# Patient Record
Sex: Female | Born: 1945 | Race: White | Hispanic: No | Marital: Married | Smoking: Never smoker
Health system: Southern US, Community
[De-identification: ages and names within clinical notes are randomized; demographics above are authoritative.]

## PROBLEM LIST (undated history)

## (undated) DIAGNOSIS — E119 Type 2 diabetes mellitus without complications: Secondary | ICD-10-CM

## (undated) DIAGNOSIS — I10 Essential (primary) hypertension: Secondary | ICD-10-CM

## (undated) HISTORY — PX: EYE SURGERY: SHX253

## (undated) HISTORY — PX: ABDOMINAL HYSTERECTOMY: SHX81

## (undated) HISTORY — PX: TONSILLECTOMY: SUR1361

---

## 1998-06-15 ENCOUNTER — Other Ambulatory Visit: Admission: RE | Admit: 1998-06-15 | Discharge: 1998-06-15 | Payer: Self-pay | Admitting: Obstetrics and Gynecology

## 2019-11-02 ENCOUNTER — Emergency Department (HOSPITAL_BASED_OUTPATIENT_CLINIC_OR_DEPARTMENT_OTHER): Payer: Medicare Other

## 2019-11-02 ENCOUNTER — Other Ambulatory Visit: Payer: Self-pay

## 2019-11-02 ENCOUNTER — Emergency Department (HOSPITAL_BASED_OUTPATIENT_CLINIC_OR_DEPARTMENT_OTHER)
Admission: EM | Admit: 2019-11-02 | Discharge: 2019-11-02 | Disposition: A | Discharge status: Home / Self Care | Payer: Medicare Other | Attending: Emergency Medicine | Admitting: Emergency Medicine

## 2019-11-02 DIAGNOSIS — Z7982 Long term (current) use of aspirin: Secondary | ICD-10-CM | POA: Diagnosis not present

## 2019-11-02 DIAGNOSIS — W010XXA Fall on same level from slipping, tripping and stumbling without subsequent striking against object, initial encounter: Secondary | ICD-10-CM | POA: Diagnosis not present

## 2019-11-02 DIAGNOSIS — S20211A Contusion of right front wall of thorax, initial encounter: Secondary | ICD-10-CM

## 2019-11-02 DIAGNOSIS — Z79899 Other long term (current) drug therapy: Secondary | ICD-10-CM | POA: Diagnosis not present

## 2019-11-02 DIAGNOSIS — S6991XA Unspecified injury of right wrist, hand and finger(s), initial encounter: Secondary | ICD-10-CM | POA: Diagnosis present

## 2019-11-02 DIAGNOSIS — Y999 Unspecified external cause status: Secondary | ICD-10-CM | POA: Diagnosis not present

## 2019-11-02 DIAGNOSIS — S52591A Other fractures of lower end of right radius, initial encounter for closed fracture: Secondary | ICD-10-CM | POA: Diagnosis not present

## 2019-11-02 DIAGNOSIS — Y92007 Garden or yard of unspecified non-institutional (private) residence as the place of occurrence of the external cause: Secondary | ICD-10-CM | POA: Insufficient documentation

## 2019-11-02 DIAGNOSIS — Z794 Long term (current) use of insulin: Secondary | ICD-10-CM | POA: Diagnosis not present

## 2019-11-02 DIAGNOSIS — S2231XA Fracture of one rib, right side, initial encounter for closed fracture: Secondary | ICD-10-CM | POA: Insufficient documentation

## 2019-11-02 DIAGNOSIS — Y93H2 Activity, gardening and landscaping: Secondary | ICD-10-CM | POA: Insufficient documentation

## 2019-11-02 DIAGNOSIS — E119 Type 2 diabetes mellitus without complications: Secondary | ICD-10-CM | POA: Diagnosis not present

## 2019-11-02 DIAGNOSIS — W19XXXA Unspecified fall, initial encounter: Secondary | ICD-10-CM

## 2019-11-02 DIAGNOSIS — S52501A Unspecified fracture of the lower end of right radius, initial encounter for closed fracture: Secondary | ICD-10-CM

## 2019-11-02 NOTE — ED Provider Notes (Addendum)
[facility] EMERGENCY DEPARTMENT Provider Note   CSN: [id number] Arrival date & time: 11/02/19  1137     History Chief Complaint  Patient presents with  . Fall    Kristi Long is a 74 y.o. female.  The history is provided by the patient.  Fall This is a new (Patient was in her flower bed 6 days ago cutting her roses when she tripped over one of the pavers and fell approximately 2 feet backwards onto the ground out of the flower bed.) problem. Episode onset: 1 week ago. The problem occurs constantly. The problem has been gradually improving. Associated symptoms include chest pain. Pertinent negatives include no headaches and no shortness of breath. Associated symptoms comments: Right wrist pain.  Skin tear to the left leg.  Patient does think she hit her head when she fell backwards but did not lose consciousness and does not take anticoagulation.  She has not had a headache or neck pain.  Pain in her chest is worsened with certain movements of her arms.  She denies any shortness of breath.  She has had some mild swelling in her bilateral lower extremities which is worse at the end of the day and better in the morning when she gets out of bed.  She states the pain has improved but is still present with certain movements and that is why she came to be evaluated.  Also significant pain to the right wrist which is gradually improving but also occurred during the fall.. She has tried rest for the symptoms. The treatment provided moderate relief.       No past medical history on file.  There are no problems to display for this patient.      OB History   No obstetric history on file.     No family history on file.  Social History   Tobacco Use  . Smoking status: Not on file  Substance Use Topics  . Alcohol use: Not on file  . Drug use: Not on file    Home Medications Prior to Admission medications   Medication Sig Start Date End Date Taking? Authorizing  Provider  aspirin 81 MG EC tablet Take by mouth. 07/02/15  Yes [provider]  Insulin Glargine (BASAGLAR KWIKPEN) 100 UNIT/ML Inject into the skin. 09/28/19  Yes [provider]  metFORMIN (GLUCOPHAGE-XR) 500 MG 24 hr tablet TAKE 1 TABLET BY MOUTH 2 TIMES A DAY 01/31/15  Yes [provider]  alendronate (FOSAMAX) 70 MG tablet Take 70 mg by mouth once a week. 09/02/19   [provider]  ALPRAZolam Prudy Feeler) 0.5 MG tablet Take 0.5 mg by mouth 2 (two) times daily as needed. 10/26/19   [provider]  glimepiride (AMARYL) 4 MG tablet Take 4 mg by mouth 2 (two) times daily. 09/17/19   [provider]  losartan-hydrochlorothiazide (HYZAAR) 100-12.5 MG tablet Take 1 tablet by mouth daily. 10/22/19   [provider]    Allergies    Patient has no allergy information on record.  Review of Systems   Review of Systems  Respiratory: Negative for shortness of breath.   Cardiovascular: Positive for chest pain.  Neurological: Negative for headaches.  All other systems reviewed and are negative.   Physical Exam Updated Vital Signs BP (!) 148/65 (BP Location: Left Arm)   Pulse 77   Temp 98.5 F (36.9 C) (Oral)   Resp 16   Ht 5\' 3"  (1.6 m)   Wt 74.8 kg  SpO2 98%   BMI 29.23 kg/m   Physical Exam Vitals and nursing note reviewed.  Constitutional:      General: She is not in acute distress.    Appearance: Normal appearance. She is well-developed.  HENT:     Head: Normocephalic and atraumatic.     Mouth/Throat:     Mouth: Mucous membranes are moist.  Eyes:     Conjunctiva/sclera: Conjunctivae normal.     Pupils: Pupils are equal, round, and reactive to light.  Cardiovascular:     Rate and Rhythm: Normal rate and regular rhythm.     Pulses: Normal pulses.     Heart sounds: No murmur.  Pulmonary:     Effort: Pulmonary effort is normal. No respiratory distress.     Breath sounds: Normal breath sounds. No wheezing or rales.    Abdominal:     General: There is no distension.     Palpations: Abdomen is soft.     Tenderness: There is no abdominal tenderness. There is no guarding or rebound.  Musculoskeletal:        General: No tenderness. Normal range of motion.     Right wrist: Swelling and bony tenderness present. No snuff box tenderness. Normal range of motion.       Arms:     Cervical back: Normal range of motion and neck supple. No rigidity or tenderness.     Right lower leg: Edema present.     Left lower leg: Edema present.     Comments: 1+ pitting edema in the bilateral feet and ankles.  Healing skin tear to the left posterior calf  Skin:    General: Skin is warm and dry.     Findings: No erythema or rash.  Neurological:     General: No focal deficit present.     Mental Status: She is alert and oriented to person, place, and time. Mental status is at baseline.  Psychiatric:        Mood and Affect: Mood normal.        Behavior: Behavior normal.        Thought Content: Thought content normal.     ED Results / Procedures / Treatments   Labs (all labs ordered are listed, but only abnormal results are displayed) Labs Reviewed - No data to display  EKG None  Radiology DG Ribs Unilateral W/Chest Right  Result Date: 11/02/2019 CLINICAL DATA:  Fall, pain EXAM: RIGHT RIBS AND CHEST - 3+ VIEW COMPARISON:  10/19/2013 FINDINGS: No fracture or other bone lesions are seen involving the ribs. There is no evidence of pneumothorax or pleural effusion. Both lungs are clear. Heart size and mediastinal contours are within normal limits. IMPRESSION: No displaced rib fractures.  No acute abnormality of the lungs. Electronically Signed   By: Eddie Candle M.D.   On: 11/02/2019 12:34   DG Wrist Complete Right  Result Date: 11/02/2019 CLINICAL DATA:  Fall, pain EXAM: RIGHT WRIST - COMPLETE 3+ VIEW COMPARISON:  None. FINDINGS: Osteopenia. Subtle, minimally displaced intra-articular fracture of the dorsal distal radius,  seen only on lateral view. The distal ulna appears intact. Carpus is normally aligned. Soft tissues are unremarkable. IMPRESSION: Subtle, minimally displaced intra-articular fracture of the dorsal distal radius, seen only on lateral view. The distal ulna appears intact. Electronically Signed   By: Eddie Candle M.D.   On: 11/02/2019 12:38    Procedures Procedures (including critical care time)  Medications Ordered in ED Medications - No data to display  ED  Course  I have reviewed the triage vital signs and the nursing notes.  Pertinent labs & imaging results that were available during my care of the patient were reviewed by me and considered in my medical decision making (see chart for details).    MDM Rules/Calculators/A&P                      Patient is a 74 year old female with a fall 6 days ago with ongoing tenderness in the right chest with certain movements and pain in her right wrist.  Patient does not take anticoagulation and states she did hit her head mildly but has not had any headaches or neck pain.  Patient is well-appearing on exam with reassuring vital signs.  Breath sounds are equal bilaterally with low suspicion for pneumothorax.  We will do plain films to ensure no wrist or rib fracture.  Patient does have some mild edema in her lower extremities but reports is better in the morning when she gets out of bed and worse towards the end of the day.  She also states she is on her feet regularly and is most likely venous insufficiency. 1:18 PM Chest x-ray without acute findings, wrist film shows a subtle minimally displaced intra-articular fracture of the dorsal distal radius with an intact ulna.  Patient will be placed in a splint and given follow-up with sports medicine.  2:04 PM Patient examined after splint was placed with good capillary refill and normal sensation in the fingers. Final Clinical Impression(s) / ED Diagnoses Final diagnoses:  Fall, initial encounter  Closed  fracture of distal end of right radius, unspecified fracture morphology, initial encounter  Rib contusion, right, initial encounter    Rx / DC Orders ED Discharge Orders    None       Gwyneth Sprout, MD 11/02/19 1322    Gwyneth Sprout, MD 11/02/19 1404

## 2019-11-02 NOTE — ED Triage Notes (Signed)
Pt sustained mechanical fall on Thursday and has musculoskeletal pain across right side of chest with movement, and sternal pain. Right wrist pain and a skin tear on the left leg.

## 2019-11-02 NOTE — Discharge Instructions (Signed)
You need to keep the splint dry and it cannot be removed.  Follow up with Dr. Jordan Likes and they can place you in a more permanent cast.

## 2019-11-02 NOTE — ED Notes (Signed)
Call to husband Kristi Long,provided update, waiting in car.  Requests to call when ready for discharge [phone]

## 2019-11-07 ENCOUNTER — Ambulatory Visit (HOSPITAL_BASED_OUTPATIENT_CLINIC_OR_DEPARTMENT_OTHER)
Admission: RE | Admit: 2019-11-07 | Discharge: 2019-11-07 | Disposition: A | Discharge status: Home / Self Care | Payer: Medicare Other | Source: Ambulatory Visit | Attending: Family Medicine | Admitting: Family Medicine

## 2019-11-07 ENCOUNTER — Ambulatory Visit (INDEPENDENT_AMBULATORY_CARE_PROVIDER_SITE_OTHER): Payer: Medicare Other | Admitting: Family Medicine

## 2019-11-07 ENCOUNTER — Other Ambulatory Visit: Payer: Self-pay

## 2019-11-07 ENCOUNTER — Encounter: Payer: Self-pay | Admitting: Family Medicine

## 2019-11-07 VITALS — BP 181/92 | HR 81 | Ht 63.0 in | Wt 165.0 lb

## 2019-11-07 DIAGNOSIS — S52591A Other fractures of lower end of right radius, initial encounter for closed fracture: Secondary | ICD-10-CM

## 2019-11-07 DIAGNOSIS — S52501A Unspecified fracture of the lower end of right radius, initial encounter for closed fracture: Secondary | ICD-10-CM | POA: Insufficient documentation

## 2019-11-07 NOTE — Assessment & Plan Note (Signed)
Injury occurred on 3/25.  She fell backwards onto her right wrist.  No significant pain today.  She is currently on Fosamax. -Counseled on home exercise therapy and supportive care. -X-ray. -Wrist brace. -May need to consider bone density -Follow-up in 3 weeks.

## 2019-11-07 NOTE — Progress Notes (Signed)
Kristi Long - 74 y.o. female MRN [id number]  Date of birth: Feb 23, 1946  SUBJECTIVE:  Including CC & ROS.  Chief Complaint  Patient presents with  . Wrist Injury    10/27/2019    Kristi Long is a 74 y.o. female that is presenting with right wrist pain. She fell while working in her garden and landed on her back and wrist. She has bruising on the volar aspect of the wrist.  She was seen in emergency department on 3/31.  She was placed in a splint at that time.  She denies any significant pain.  The pain is occurring over the dorsum of the distal radius.  Has some limitations in her range of motion.  She is currently on Fosamax..  Independent review of the right wrist x-ray from 3/31 shows a possible nondisplaced intra-articular distal radius fracture.   Review of Systems See HPI   HISTORY: Past Medical, Surgical, Social, and Family History Reviewed & Updated per EMR.   Pertinent Historical Findings include:  History reviewed. No pertinent past medical history.  History reviewed. No pertinent surgical history.  History reviewed. No pertinent family history.  Social History   Socioeconomic History  . Marital status: Married    Spouse name: Not on file  . Number of children: Not on file  . Years of education: Not on file  . Highest education level: Not on file  Occupational History  . Not on file  Tobacco Use  . Smoking status: Never Smoker  . Smokeless tobacco: Never Used  Substance and Sexual Activity  . Alcohol use: Not on file  . Drug use: Not on file  . Sexual activity: Not on file  Other Topics Concern  . Not on file  Social History Narrative  . Not on file   Social Determinants of Health   Financial Resource Strain:   . Difficulty of Paying Living Expenses:   Food Insecurity:   . Worried About Charity fundraiser in the Last Year:   . Arboriculturist in the Last Year:   Transportation Needs:   . Film/video editor (Medical):   Marland Kitchen Lack of  Transportation (Non-Medical):   Physical Activity:   . Days of Exercise per Week:   . Minutes of Exercise per Session:   Stress:   . Feeling of Stress :   Social Connections:   . Frequency of Communication with Friends and Family:   . Frequency of Social Gatherings with Friends and Family:   . Attends Religious Services:   . Active Member of Clubs or Organizations:   . Attends Archivist Meetings:   Marland Kitchen Marital Status:   Intimate Partner Violence:   . Fear of Current or Ex-Partner:   . Emotionally Abused:   Marland Kitchen Physically Abused:   . Sexually Abused:      PHYSICAL EXAM:  VS: BP (!) 181/92   Pulse 81   Ht 5\' 3"  (1.6 m)   Wt 165 lb (74.8 kg)   BMI 29.23 kg/m  Physical Exam Gen: NAD, alert, cooperative with exam, well-appearing MSK:  Right wrist: Limited flexion and extension compared to contralateral side. Ecchymosis occurring on the volar aspect of the forearm. Normal abduction and extension of thumb. Normal pincer grasp. Negative Tinel's at the wrist. Neurovascularly intact     ASSESSMENT & PLAN:   Closed fracture of right distal radius Injury occurred on 3/25.  She fell backwards onto her right wrist.  No significant pain today.  She is currently on Fosamax. -Counseled on home exercise therapy and supportive care. -X-ray. -Wrist brace. -May need to consider bone density -Follow-up in 3 weeks.

## 2019-11-07 NOTE — Patient Instructions (Signed)
Nice to meet you Please take vitamin D and calcium  Please try vitamin K2  Please try the range of motion movements  Please try ice  Please use the brace I will call with the results from today   Please send me a message in MyChart with any questions or updates.  Please see me back in 3 weeks.   --Dr. Jordan Likes

## 2019-11-08 ENCOUNTER — Telehealth: Payer: Self-pay | Admitting: Family Medicine

## 2019-11-08 NOTE — Telephone Encounter (Signed)
Spoke to patient and gave result information as provided by physician. 

## 2019-11-08 NOTE — Telephone Encounter (Signed)
Left VM for patient. If she calls back please have her speak with a nurse/CMA and inform that her fracture is stable and we can continue with the brace.   If any questions then please take the best time and phone number to call and I will try to call her back.   Myra Rude, MD [facility] 11/08/2019, 8:27 AM

## 2019-11-08 NOTE — Telephone Encounter (Signed)
Patient returning call for results 

## 2019-11-09 ENCOUNTER — Other Ambulatory Visit: Payer: Self-pay | Admitting: Family Medicine

## 2019-11-09 ENCOUNTER — Encounter: Payer: Self-pay | Admitting: General Practice

## 2019-11-09 DIAGNOSIS — M8000XA Age-related osteoporosis with current pathological fracture, unspecified site, initial encounter for fracture: Secondary | ICD-10-CM

## 2019-11-11 ENCOUNTER — Ambulatory Visit (HOSPITAL_BASED_OUTPATIENT_CLINIC_OR_DEPARTMENT_OTHER)
Admission: RE | Admit: 2019-11-11 | Discharge: 2019-11-11 | Disposition: A | Discharge status: Home / Self Care | Payer: Medicare Other | Source: Ambulatory Visit | Attending: Family Medicine | Admitting: Family Medicine

## 2019-11-11 ENCOUNTER — Other Ambulatory Visit: Payer: Self-pay

## 2019-11-11 DIAGNOSIS — M8000XA Age-related osteoporosis with current pathological fracture, unspecified site, initial encounter for fracture: Secondary | ICD-10-CM | POA: Insufficient documentation

## 2019-11-15 ENCOUNTER — Telehealth: Payer: Self-pay | Admitting: Family Medicine

## 2019-11-15 NOTE — Telephone Encounter (Signed)
Patient returning call for bone density results

## 2019-11-15 NOTE — Telephone Encounter (Signed)
Spoke to patient and gave result information as provided by the physician. Patient would like to try the Evenity.

## 2019-11-15 NOTE — Telephone Encounter (Signed)
Left VM for patient. If she calls back please have her speak with a nurse/CMA and inform that her bone density is showing osteoporosis. We can try evenity which is a bone builder if she would like.   If any questions then please take the best time and phone number to call and I will try to call her back.   Myra Rude, MD [facility] 11/15/2019, 8:13 AM

## 2019-11-16 NOTE — Telephone Encounter (Signed)
Will try evenity.   Myra Rude, MD [facility] 11/16/2019, 12:09 PM

## 2019-11-17 ENCOUNTER — Encounter: Payer: Self-pay | Admitting: Family Medicine

## 2019-11-30 ENCOUNTER — Ambulatory Visit (HOSPITAL_BASED_OUTPATIENT_CLINIC_OR_DEPARTMENT_OTHER)
Admission: RE | Admit: 2019-11-30 | Discharge: 2019-11-30 | Disposition: A | Discharge status: Home / Self Care | Payer: Medicare Other | Source: Ambulatory Visit | Attending: Family Medicine | Admitting: Family Medicine

## 2019-11-30 ENCOUNTER — Other Ambulatory Visit: Payer: Self-pay

## 2019-11-30 ENCOUNTER — Ambulatory Visit (INDEPENDENT_AMBULATORY_CARE_PROVIDER_SITE_OTHER): Payer: Medicare Other | Admitting: Family Medicine

## 2019-11-30 ENCOUNTER — Encounter: Payer: Self-pay | Admitting: Family Medicine

## 2019-11-30 VITALS — BP 143/86 | HR 86 | Ht 62.0 in | Wt 165.0 lb

## 2019-11-30 DIAGNOSIS — S52591D Other fractures of lower end of right radius, subsequent encounter for closed fracture with routine healing: Secondary | ICD-10-CM

## 2019-11-30 DIAGNOSIS — M8000XD Age-related osteoporosis with current pathological fracture, unspecified site, subsequent encounter for fracture with routine healing: Secondary | ICD-10-CM

## 2019-11-30 NOTE — Progress Notes (Addendum)
  Kristi Long - 74 y.o. female MRN [id number]  Date of birth: Jun 03, 1946  SUBJECTIVE:  Including CC & ROS.  Chief Complaint  Patient presents with  . Follow-up    follow up for right wrist    Kristi Long is a 74 y.o. female that is following up for right distal radius fracture.  Is she reports no significant pain.  She does have some loss of the range of motion.  Has been using a Velcro splint.   Review of Systems See HPI   HISTORY: Past Medical, Surgical, Social, and Family History Reviewed & Updated per EMR.   Pertinent Historical Findings include:  No past medical history on file.  No past surgical history on file.  No family history on file.  Social History   Socioeconomic History  . Marital status: Married    Spouse name: Not on file  . Number of children: Not on file  . Years of education: Not on file  . Highest education level: Not on file  Occupational History  . Not on file  Tobacco Use  . Smoking status: Never Smoker  . Smokeless tobacco: Never Used  Substance and Sexual Activity  . Alcohol use: Not on file  . Drug use: Not on file  . Sexual activity: Not on file  Other Topics Concern  . Not on file  Social History Narrative  . Not on file   Social Determinants of Health   Financial Resource Strain:   . Difficulty of Paying Living Expenses:   Food Insecurity:   . Worried About Programme researcher, broadcasting/film/video in the Last Year:   . Barista in the Last Year:   Transportation Needs:   . Freight forwarder (Medical):   Marland Kitchen Lack of Transportation (Non-Medical):   Physical Activity:   . Days of Exercise per Week:   . Minutes of Exercise per Session:   Stress:   . Feeling of Stress :   Social Connections:   . Frequency of Communication with Friends and Family:   . Frequency of Social Gatherings with Friends and Family:   . Attends Religious Services:   . Active Member of Clubs or Organizations:   . Attends Banker Meetings:    Marland Kitchen Marital Status:   Intimate Partner Violence:   . Fear of Current or Ex-Partner:   . Emotionally Abused:   Marland Kitchen Physically Abused:   . Sexually Abused:      PHYSICAL EXAM:  VS: BP (!) 143/86   Pulse 86   Ht 5\' 2"  (1.575 m)   Wt 165 lb (74.8 kg)   BMI 30.18 kg/m  Physical Exam Gen: NAD, alert, cooperative with exam, well-appearing MSK:  Right wrist: Ecchymosis or swelling. Some tenderness to palpation at the distal radius. Limited flexion and extension of the wrist. Normal grip strength. Normal thumb abduction. Neurovascularly intact     ASSESSMENT & PLAN:   Closed fracture of right distal radius Injury occurred on 3/25.  Bone density was showing osteoporosis. -X-ray. -Counseled on home exercise therapy and supportive care. -Counseled on weaning out of the Velcro splint. -Evenity -Could consider physical therapy.  Osteoporosis with current pathological fracture Bone density showing osteoporosis  - evenity

## 2019-11-30 NOTE — Patient Instructions (Addendum)
Good to see you Please try using the splint while you're active  Please use ice if needed  I will call with the results from today   Please send me a message in MyChart with any questions or updates.  Please see [place] back in 4 weeks or as needed for the wrist.  Please follow monthly for the evenity injection .   --Dr. Jordan Likes

## 2019-11-30 NOTE — Assessment & Plan Note (Signed)
Injury occurred on 3/25.  Bone density was showing osteoporosis. -X-ray. -Counseled on home exercise therapy and supportive care. -Counseled on weaning out of the Velcro splint. -Evenity -Could consider physical therapy.

## 2019-12-01 ENCOUNTER — Telehealth: Payer: Self-pay | Admitting: Family Medicine

## 2019-12-01 DIAGNOSIS — M8000XA Age-related osteoporosis with current pathological fracture, unspecified site, initial encounter for fracture: Secondary | ICD-10-CM | POA: Insufficient documentation

## 2019-12-01 NOTE — Assessment & Plan Note (Signed)
Bone density showing osteoporosis  - evenity

## 2019-12-01 NOTE — Telephone Encounter (Signed)
Informed of results.   Myra Rude, MD [facility] 12/01/2019, 9:30 AM

## 2019-12-30 ENCOUNTER — Ambulatory Visit (INDEPENDENT_AMBULATORY_CARE_PROVIDER_SITE_OTHER): Payer: Medicare Other | Admitting: Family Medicine

## 2019-12-30 ENCOUNTER — Other Ambulatory Visit: Payer: Self-pay

## 2019-12-30 DIAGNOSIS — M8000XD Age-related osteoporosis with current pathological fracture, unspecified site, subsequent encounter for fracture with routine healing: Secondary | ICD-10-CM | POA: Diagnosis present

## 2019-12-30 NOTE — Progress Notes (Signed)
  Kristi Long - 74 y.o. female MRN [id number]  Date of birth: 1945/10/22  SUBJECTIVE:  Including CC & ROS.  Chief Complaint  Patient presents with  . Follow-up    injection    Kristi Long is a 73 y.o. female that is evenity injection. Patient tolerated procedure.

## 2020-01-30 ENCOUNTER — Ambulatory Visit (INDEPENDENT_AMBULATORY_CARE_PROVIDER_SITE_OTHER): Payer: Medicare Other | Admitting: Family Medicine

## 2020-01-30 ENCOUNTER — Other Ambulatory Visit: Payer: Self-pay

## 2020-01-30 DIAGNOSIS — M8000XD Age-related osteoporosis with current pathological fracture, unspecified site, subsequent encounter for fracture with routine healing: Secondary | ICD-10-CM | POA: Diagnosis present

## 2020-01-30 MED ORDER — ROMOSOZUMAB-AQQG 105 MG/1.17ML ~~LOC~~ SOSY
210.0000 mg | PREFILLED_SYRINGE | Freq: Once | SUBCUTANEOUS | Status: AC
  Administered 2020-01-30: 210 mg via SUBCUTANEOUS

## 2020-01-30 NOTE — Progress Notes (Signed)
  Kristi Long - 74 y.o. female MRN [id number]  Date of birth: 06-27-1946  SUBJECTIVE:  Including CC & ROS.  No chief complaint on file.   Kristi Long is a 74 y.o. female that is presenting for Evenity injection.  Patient tolerated procedure.

## 2020-02-23 ENCOUNTER — Emergency Department (HOSPITAL_BASED_OUTPATIENT_CLINIC_OR_DEPARTMENT_OTHER)
Admission: EM | Admit: 2020-02-23 | Discharge: 2020-02-23 | Disposition: A | Discharge status: Home / Self Care | Payer: Medicare Other | Attending: Emergency Medicine | Admitting: Emergency Medicine

## 2020-02-23 ENCOUNTER — Other Ambulatory Visit: Payer: Self-pay

## 2020-02-23 ENCOUNTER — Emergency Department (HOSPITAL_BASED_OUTPATIENT_CLINIC_OR_DEPARTMENT_OTHER): Payer: Medicare Other

## 2020-02-23 ENCOUNTER — Encounter (HOSPITAL_BASED_OUTPATIENT_CLINIC_OR_DEPARTMENT_OTHER): Payer: Self-pay | Admitting: *Deleted

## 2020-02-23 DIAGNOSIS — Y9289 Other specified places as the place of occurrence of the external cause: Secondary | ICD-10-CM | POA: Diagnosis not present

## 2020-02-23 DIAGNOSIS — E119 Type 2 diabetes mellitus without complications: Secondary | ICD-10-CM | POA: Diagnosis not present

## 2020-02-23 DIAGNOSIS — Y998 Other external cause status: Secondary | ICD-10-CM | POA: Insufficient documentation

## 2020-02-23 DIAGNOSIS — Z794 Long term (current) use of insulin: Secondary | ICD-10-CM | POA: Diagnosis not present

## 2020-02-23 DIAGNOSIS — R0789 Other chest pain: Secondary | ICD-10-CM | POA: Diagnosis present

## 2020-02-23 DIAGNOSIS — Y9389 Activity, other specified: Secondary | ICD-10-CM | POA: Diagnosis not present

## 2020-02-23 DIAGNOSIS — I10 Essential (primary) hypertension: Secondary | ICD-10-CM | POA: Insufficient documentation

## 2020-02-23 DIAGNOSIS — W19XXXA Unspecified fall, initial encounter: Secondary | ICD-10-CM | POA: Diagnosis not present

## 2020-02-23 DIAGNOSIS — Z7982 Long term (current) use of aspirin: Secondary | ICD-10-CM | POA: Diagnosis not present

## 2020-02-23 DIAGNOSIS — Z79899 Other long term (current) drug therapy: Secondary | ICD-10-CM | POA: Insufficient documentation

## 2020-02-23 DIAGNOSIS — S20211A Contusion of right front wall of thorax, initial encounter: Secondary | ICD-10-CM | POA: Diagnosis not present

## 2020-02-23 HISTORY — DX: Type 2 diabetes mellitus without complications: E11.9

## 2020-02-23 HISTORY — DX: Essential (primary) hypertension: I10

## 2020-02-23 MED ORDER — HYDROCODONE-ACETAMINOPHEN 5-325 MG PO TABS
1.0000 | ORAL_TABLET | Freq: Once | ORAL | Status: AC
  Administered 2020-02-23: 1 via ORAL
  Filled 2020-02-23: qty 1

## 2020-02-23 NOTE — ED Provider Notes (Signed)
[facility] EMERGENCY DEPARTMENT Provider Note   CSN: [id number] Arrival date & time: 02/23/20  1405     History Chief Complaint  Patient presents with  . Fall    Kristi Long is a 74 y.o. female who presents for evaluation of right lateral chest wall pain that began after mechanical fall that occurred approximately 5 days ago.  She reports that she was trying to step out of her door and turn and states that she lost her balance and fell.  She states she landed primarily on her right chest wall.  She states she may have hit her head but did not have any LOC.  She is not on blood thinners. She reports her head has not been bothering since then. She reports that since then, she has had pain primarily to the right lateral chest wall.  She states that it has gotten worse.  She has been tried to take over-the-counter Tylenol and meloxicam with minimal improvement.  She feels like it is worse when she tries to take a deep breath and feels like she has trouble breathing secondary to that.  She has not had any fevers, cough.  She denies any dizziness.  She denies any nausea/vomiting.  She has been able to ambulate without any difficulty and denies any numbness/weakness of arms or legs.  The history is provided by the patient.       Past Medical History:  Diagnosis Date  . Diabetes mellitus without complication (HCC)   . Hypertension     Patient Active Problem List   Diagnosis Date Noted  . Osteoporosis with current pathological fracture 12/01/2019  . Closed fracture of right distal radius 11/07/2019    Past Surgical History:  Procedure Laterality Date  . ABDOMINAL HYSTERECTOMY    . EYE SURGERY    . TONSILLECTOMY       OB History   No obstetric history on file.     No family history on file.  Social History   Tobacco Use  . Smoking status: Never Smoker  . Smokeless tobacco: Never Used  Substance Use Topics  . Alcohol use: Never  . Drug use: Never     Home Medications Prior to Admission medications   Medication Sig Start Date End Date Taking? Authorizing Provider  alendronate (FOSAMAX) 70 MG tablet Take 70 mg by mouth once a week. 09/02/19   [provider]  ALPRAZolam Prudy Feeler) 0.5 MG tablet Take 0.5 mg by mouth 2 (two) times daily as needed. 10/26/19   [provider]  aspirin 81 MG EC tablet Take by mouth. 07/02/15   [provider]  glimepiride (AMARYL) 4 MG tablet Take 4 mg by mouth 2 (two) times daily. 09/17/19   [provider]  Insulin Glargine (BASAGLAR KWIKPEN) 100 UNIT/ML Inject into the skin. 09/28/19   [provider]  losartan-hydrochlorothiazide (HYZAAR) 100-12.5 MG tablet Take 1 tablet by mouth daily. 10/22/19   [provider]  metFORMIN (GLUCOPHAGE-XR) 500 MG 24 hr tablet TAKE 1 TABLET BY MOUTH 2 TIMES A DAY 01/31/15   [provider]    Allergies    Patient has no known allergies.  Review of Systems   Review of Systems  Constitutional: Negative for fever.  Respiratory: Negative for cough and shortness of breath.   Cardiovascular: Negative for chest pain.  Gastrointestinal: Negative for abdominal pain, nausea and vomiting.  Genitourinary: Negative for dysuria and hematuria.  Musculoskeletal:       Right chest wall  pain  Neurological: Negative for headaches.    Physical Exam Updated Vital Signs BP (!) 170/80   Pulse 75   Temp 98.3 F (36.8 C) (Oral)   Resp 16   Ht 5\' 2"  (1.575 m)   Wt 74.8 kg   SpO2 96%   BMI 30.18 kg/m   Physical Exam Vitals and nursing note reviewed.  Constitutional:      Appearance: She is well-developed.  HENT:     Head: Normocephalic and atraumatic.     Comments: No tenderness to palpation of skull. No deformities or crepitus noted. No open wounds, abrasions or lacerations.  Eyes:     General: No scleral icterus.       Right eye: No discharge.        Left eye: No discharge.     Conjunctiva/sclera: Conjunctivae  normal.     Comments: PERRL. EOMs intact. No nystagmus. No neglect.   Neck:     Comments: Full flexion/extension and lateral movement of neck fully intact. No bony midline tenderness. No deformities or crepitus.  Pulmonary:     Effort: Pulmonary effort is normal.     Comments: Lungs clear to auscultation bilaterally.  Symmetric chest rise.  No wheezing, rales, rhonchi.  Able speak in full sentences without any difficulty. Chest:       Comments: Patient with point tenderness noted to the lateral aspect of the right chest wall overlying ribs 8 and 9.  No deformity or crepitus noted. Abdominal:     Comments: Abdomen is soft, non-distended, non-tender. No rigidity, No guarding. No peritoneal signs.  Skin:    General: Skin is warm and dry.  Neurological:     Mental Status: She is alert.     Sensory: Sensation is intact.     Motor: Motor function is intact.     Coordination: Coordination is intact.  Psychiatric:        Speech: Speech normal.        Behavior: Behavior normal.     ED Results / Procedures / Treatments   Labs (all labs ordered are listed, but only abnormal results are displayed) Labs Reviewed - No data to display  EKG None  Radiology DG Ribs Unilateral W/Chest Right  Result Date: 02/23/2020 CLINICAL DATA:  Fall with right rib pain and shortness of breath EXAM: RIGHT RIBS AND CHEST - 3+ VIEW COMPARISON:  Radiograph 11/02/2019, CT 03/20/2010 FINDINGS: No visible displaced rib fractures or other acute osseous injury. Chronic hyperinflation of the lungs with some coarsened interstitial changes similar to prior. No consolidation, features of edema, pneumothorax, or effusion. The cardiomediastinal contours are unremarkable. Cholecystectomy clips in the right upper quadrant. IMPRESSION: 1. No visible displaced rib fractures or other acute osseous injury. 2. Chronically hyperinflated lungs with coarsened interstitial changes, similar to prior. Electronically Signed   By: 03/22/2010 M.D.   On: 02/23/2020 15:14    Procedures Procedures (including critical care time)  Medications Ordered in ED Medications  HYDROcodone-acetaminophen (NORCO/VICODIN) 5-325 MG per tablet 1 tablet (1 tablet Oral Given 02/23/20 1635)    ED Course  I have reviewed the triage vital signs and the nursing notes.  Pertinent labs & imaging results that were available during my care of the patient were reviewed by me and considered in my medical decision making (see chart for details).    MDM Rules/Calculators/A&P  74 year old female who presents for evaluation of right lateral chest wall pain after mechanical fall that occurred approximately 5 days ago.  She reports that since then, the pain has gotten worse.  She states it is worse when she tries to take a deep breath in.  She does report that maybe she hit her head but did not have any LOC.  She is not on blood thinners.  She states she is not having any issues with her head since then.  On initial arrival, she is afebrile, nontoxic-appearing.  Vital signs are stable.  She is slightly hypertensive but otherwise unremarkable.  On exam, she has point tenderness of the right lateral chest wall at approximately ribs 8 and 9.  No deformity crepitus noted.  Concern for contusion versus fracture.  Plan for chest x-ray.  Patient not complaining of any head injury, she is not on blood thinners and denies any LOC.  No indication for head CT at this time.  Chest x-ray shows no visible displaced rib fractures.  She has chronically hyperinflated lungs with coarse interstitial changes similar to prior.  Discussed results with patient.  This with her that this most likely rib contusion.  We will plan to treat with pain control.  Patient reviewed on PMP.  Patient provided incentive spirometer. Discussed patient with Dr. Fredderick Phenix who is agreeable for plan. At this time, patient exhibits no emergent life-threatening condition that require  further evaluation in ED. Patient had ample opportunity for questions and discussion. All patient's questions were answered with full understanding. Strict return precautions discussed. Patient expresses understanding and agreement to plan.   Portions of this note were generated with Scientist, clinical (histocompatibility and immunogenetics). Dictation errors may occur despite best attempts at proofreading.   Final Clinical Impression(s) / ED Diagnoses Final diagnoses:  Contusion of rib on right side, initial encounter    Rx / DC Orders ED Discharge Orders    None       Maxwell Caul, PA-C 02/23/20 1827    Rolan Bucco, MD 02/24/20 [phone]

## 2020-02-23 NOTE — ED Triage Notes (Signed)
She lost her balance and fell 5 days ago. Injury to her right ribs. She has felt SOB since the injury.

## 2020-02-23 NOTE — Discharge Instructions (Signed)
You can take 1000 mg of Tylenol.  Do not exceed 4000 mg of Tylenol a day.  Take pain medications as directed for break through pain. Do not drive or operate machinery while taking this medication.   Use the incentive spirometer as directed.  Follow-up with your primary care doctor.  Return to emergency department for any worsening difficulty breathing, worsening pain, vomiting, fever, cough or any other worsening concerning symptoms.

## 2020-02-29 ENCOUNTER — Ambulatory Visit (INDEPENDENT_AMBULATORY_CARE_PROVIDER_SITE_OTHER): Payer: Medicare Other | Admitting: Family Medicine

## 2020-02-29 ENCOUNTER — Other Ambulatory Visit: Payer: Self-pay

## 2020-02-29 DIAGNOSIS — M8000XD Age-related osteoporosis with current pathological fracture, unspecified site, subsequent encounter for fracture with routine healing: Secondary | ICD-10-CM | POA: Diagnosis present

## 2020-02-29 MED ORDER — ROMOSOZUMAB-AQQG 105 MG/1.17ML ~~LOC~~ SOSY
210.0000 mg | PREFILLED_SYRINGE | Freq: Once | SUBCUTANEOUS | Status: AC
  Administered 2020-02-29: 210 mg via SUBCUTANEOUS

## 2020-02-29 NOTE — Progress Notes (Signed)
  Cristin Penaflor Macht - 74 y.o. female MRN [id number]  Date of birth: 12-22-1945  SUBJECTIVE:  Including CC & ROS.  No chief complaint on file.   Gwenette Wellons Delima is a 74 y.o. female that is here for Evenity injection.  Patient tolerated procedure.

## 2020-03-19 ENCOUNTER — Encounter: Payer: Self-pay | Admitting: Family Medicine

## 2020-03-19 DIAGNOSIS — S2241XA Multiple fractures of ribs, right side, initial encounter for closed fracture: Secondary | ICD-10-CM

## 2020-03-19 MED ORDER — NAPROXEN 500 MG PO TABS: 500.0000 mg | ORAL_TABLET | Freq: Two times a day (BID) | ORAL | 1 refills | Status: AC

## 2020-03-19 MED ORDER — LIDOCAINE 5 % EX PTCH: 1.0000 | MEDICATED_PATCH | Freq: Two times a day (BID) | CUTANEOUS | 2 refills | Status: AC

## 2020-03-19 NOTE — Telephone Encounter (Signed)
Spoke with patient about her multiple rib fractures. Will provide naproxen and lidoderm patches. She has pain medicine from the ER. Has an incentive spirometer. May need to consider CT scan if pain is worsening.   Myra Rude, MD [facility] 03/19/2020, 5:22 PM

## 2020-03-30 ENCOUNTER — Ambulatory Visit (HOSPITAL_BASED_OUTPATIENT_CLINIC_OR_DEPARTMENT_OTHER)
Admission: RE | Admit: 2020-03-30 | Discharge: 2020-03-30 | Disposition: A | Discharge status: Home / Self Care | Payer: Medicare Other | Source: Ambulatory Visit | Attending: Family Medicine | Admitting: Family Medicine

## 2020-03-30 ENCOUNTER — Other Ambulatory Visit: Payer: Self-pay

## 2020-03-30 ENCOUNTER — Ambulatory Visit (INDEPENDENT_AMBULATORY_CARE_PROVIDER_SITE_OTHER): Payer: Medicare Other | Admitting: Family Medicine

## 2020-03-30 DIAGNOSIS — M546 Pain in thoracic spine: Secondary | ICD-10-CM | POA: Diagnosis present

## 2020-03-30 DIAGNOSIS — S2241XA Multiple fractures of ribs, right side, initial encounter for closed fracture: Secondary | ICD-10-CM | POA: Diagnosis present

## 2020-03-30 MED ORDER — ROMOSOZUMAB-AQQG 105 MG/1.17ML ~~LOC~~ SOSY
210.0000 mg | PREFILLED_SYRINGE | Freq: Once | SUBCUTANEOUS | Status: AC
  Administered 2020-03-30: 210 mg via SUBCUTANEOUS

## 2020-03-30 NOTE — Progress Notes (Signed)
  Kristi Long - 74 y.o. female MRN [id number]  Date of birth: February 12, 1946  SUBJECTIVE:  Including CC & ROS.  No chief complaint on file.   Kristi Long is a 73 y.o. female that is presenting for Evenity injection.  She is also presenting with right rib pain and right thoracic back pain.  She had a fall on 8/9 and had multiple rib fractures.  She is still having midthoracic back pain as well.  Has known history of osteoporosis..    Review of Systems See HPI   HISTORY: Past Medical, Surgical, Social, and Family History Reviewed & Updated per EMR.   Pertinent Historical Findings include:  Past Medical History:  Diagnosis Date  . Diabetes mellitus without complication (HCC)   . Hypertension     Past Surgical History:  Procedure Laterality Date  . ABDOMINAL HYSTERECTOMY    . EYE SURGERY    . TONSILLECTOMY      No family history on file.  Social History   Socioeconomic History  . Marital status: Married    Spouse name: Not on file  . Number of children: Not on file  . Years of education: Not on file  . Highest education level: Not on file  Occupational History  . Not on file  Tobacco Use  . Smoking status: Never Smoker  . Smokeless tobacco: Never Used  Substance and Sexual Activity  . Alcohol use: Never  . Drug use: Never  . Sexual activity: Not on file  Other Topics Concern  . Not on file  Social History Narrative  . Not on file   Social Determinants of Health   Financial Resource Strain:   . Difficulty of Paying Living Expenses: Not on file  Food Insecurity:   . Worried About Programme researcher, broadcasting/film/video in the Last Year: Not on file  . Ran Out of Food in the Last Year: Not on file  Transportation Needs:   . Lack of Transportation (Medical): Not on file  . Lack of Transportation (Non-Medical): Not on file  Physical Activity:   . Days of Exercise per Week: Not on file  . Minutes of Exercise per Session: Not on file  Stress:   . Feeling of Stress : Not  on file  Social Connections:   . Frequency of Communication with Friends and Family: Not on file  . Frequency of Social Gatherings with Friends and Family: Not on file  . Attends Religious Services: Not on file  . Active Member of Clubs or Organizations: Not on file  . Attends Banker Meetings: Not on file  . Marital Status: Not on file  Intimate Partner Violence:   . Fear of Current or Ex-Partner: Not on file  . Emotionally Abused: Not on file  . Physically Abused: Not on file  . Sexually Abused: Not on file     PHYSICAL EXAM:  VS: There were no vitals taken for this visit. Physical Exam Gen: NAD, alert, cooperative with exam, well-appearing   ASSESSMENT & PLAN:   Multiple closed fractures of ribs of right side Injury occurred on 8/9.  Has frequent falls.   Acute right-sided thoracic back pain Has been ongoing since her fall on 8/9. Denies shortness of breath  - xray

## 2020-03-30 NOTE — Assessment & Plan Note (Signed)
Has been ongoing since her fall on 8/9. Denies shortness of breath  - xray

## 2020-03-30 NOTE — Addendum Note (Signed)
Addended by: Kathi Simpers F on: 03/30/2020 11:08 AM   Modules accepted: Orders

## 2020-03-30 NOTE — Assessment & Plan Note (Signed)
Injury occurred on 8/9.  Has frequent falls.

## 2020-04-02 ENCOUNTER — Telehealth: Payer: Self-pay | Admitting: Family Medicine

## 2020-04-02 NOTE — Telephone Encounter (Signed)
Forwarding message to med asst that pt returned provider's call for Results--See Dr.Schmitz's instructions.  --glh

## 2020-04-02 NOTE — Telephone Encounter (Signed)
Left VM for patient. If she calls back please have her speak with a nurse/CMA and inform her xrays do not show a fracture of the thoracic spine.   If any questions then please take the best time and phone number to call and I will try to call her back.   Myra Rude, MD [facility] 04/02/2020, 10:36 AM

## 2020-04-02 NOTE — Telephone Encounter (Signed)
Spoke to patient and gave x-ray results as provided by the physician.

## 2020-04-24 ENCOUNTER — Encounter: Payer: Self-pay | Admitting: Family Medicine

## 2020-05-04 ENCOUNTER — Other Ambulatory Visit: Payer: Self-pay

## 2020-05-04 ENCOUNTER — Ambulatory Visit (INDEPENDENT_AMBULATORY_CARE_PROVIDER_SITE_OTHER): Payer: Medicare Other | Admitting: Family Medicine

## 2020-05-04 DIAGNOSIS — M25552 Pain in left hip: Secondary | ICD-10-CM | POA: Diagnosis not present

## 2020-05-04 DIAGNOSIS — M8000XD Age-related osteoporosis with current pathological fracture, unspecified site, subsequent encounter for fracture with routine healing: Secondary | ICD-10-CM

## 2020-05-04 NOTE — Assessment & Plan Note (Signed)
Has a history of trochanteric bursitis with previous injection. -Counseled on home exercise therapy and supportive care. -Provided Rayos samples

## 2020-05-04 NOTE — Progress Notes (Signed)
Medication Samples have been provided to the patient.  Drug name: Pennsaid       Strength: 2%        Qty: 2 boxes  LOT: [id number]  Exp.Date: 10/2020  Dosing instructions: use a pea size amount and rub gently.  The patient has been instructed regarding the correct time, dose, and frequency of taking this medication, including desired effects and most common side effects.   Kathi Simpers, MA 10:53 AM 05/04/2020

## 2020-05-04 NOTE — Progress Notes (Signed)
  Kristi Long - 74 y.o. female MRN [id number]  Date of birth: 04/02/1946  SUBJECTIVE:  Including CC & ROS.  No chief complaint on file.   [name] is a 74 y.o. female that is here for Evenity injection.  Patient tolerated procedure.     

## 2020-05-10 ENCOUNTER — Other Ambulatory Visit: Payer: Self-pay | Admitting: Family Medicine

## 2020-05-10 DIAGNOSIS — S2241XA Multiple fractures of ribs, right side, initial encounter for closed fracture: Secondary | ICD-10-CM

## 2020-06-08 ENCOUNTER — Other Ambulatory Visit: Payer: Self-pay

## 2020-06-08 ENCOUNTER — Ambulatory Visit (INDEPENDENT_AMBULATORY_CARE_PROVIDER_SITE_OTHER): Payer: Medicare Other | Admitting: Family Medicine

## 2020-06-08 DIAGNOSIS — M8000XD Age-related osteoporosis with current pathological fracture, unspecified site, subsequent encounter for fracture with routine healing: Secondary | ICD-10-CM

## 2020-06-08 NOTE — Progress Notes (Signed)
  Saiya Crist Sprunger - 74 y.o. female MRN [id number]  Date of birth: September 14, 1945  SUBJECTIVE:  Including CC & ROS.  No chief complaint on file.   Rashon Rezek Rowley is a 74 y.o. female that is here for Evenity injection.  Patient tolerated procedure.

## 2020-07-06 ENCOUNTER — Ambulatory Visit: Payer: Medicare Other | Admitting: Family Medicine

## 2020-08-21 ENCOUNTER — Telehealth: Payer: Self-pay | Admitting: Family Medicine

## 2020-08-21 NOTE — Telephone Encounter (Signed)
Called pt to give her an update on injection status Evenity approved by [employer]--- wishing to schedule pt for OV --glh

## 2020-09-21 ENCOUNTER — Ambulatory Visit (INDEPENDENT_AMBULATORY_CARE_PROVIDER_SITE_OTHER): Payer: Medicare Other | Admitting: Family Medicine

## 2020-09-21 ENCOUNTER — Other Ambulatory Visit: Payer: Self-pay

## 2020-09-21 DIAGNOSIS — M8000XD Age-related osteoporosis with current pathological fracture, unspecified site, subsequent encounter for fracture with routine healing: Secondary | ICD-10-CM

## 2020-09-21 MED ORDER — ROMOSOZUMAB-AQQG 105 MG/1.17ML ~~LOC~~ SOSY: 210.0000 mg | PREFILLED_SYRINGE | Freq: Once | SUBCUTANEOUS | Status: DC

## 2020-09-21 NOTE — Progress Notes (Signed)
Patient is here today for nurse visit Evenity. Patient tolerated injections well. Her blood pressure is elevated today. She states she is going to take her medication when she gets home today and she is going to discuss her hypertension and possible changing her blood pressure medication.

## 2020-09-26 NOTE — Assessment & Plan Note (Signed)
evenity injection.

## 2020-09-26 NOTE — Progress Notes (Signed)
  Kristi Long - 75 y.o. female MRN [id number]  Date of birth: 04-Feb-1946  SUBJECTIVE:  Including CC & ROS.  No chief complaint on file.   Kristi Long is a 75 y.o. female that is  Here for evenity injection.     ASSESSMENT & PLAN:   Osteoporosis with current pathological fracture evenity injection.

## 2020-10-19 ENCOUNTER — Ambulatory Visit: Payer: Medicare Other | Admitting: Family Medicine

## 2020-10-22 IMAGING — CR DG RIBS W/ CHEST 3+V*R*
3 series · 3 of 3 positions shown · non-contrast
Comparison: Radiograph 11/02/2019, CT 03/20/2010

CLINICAL DATA: Fall with right rib pain and shortness of breath

EXAM:
RIGHT RIBS AND CHEST - 3+ VIEW

[w chest pa]
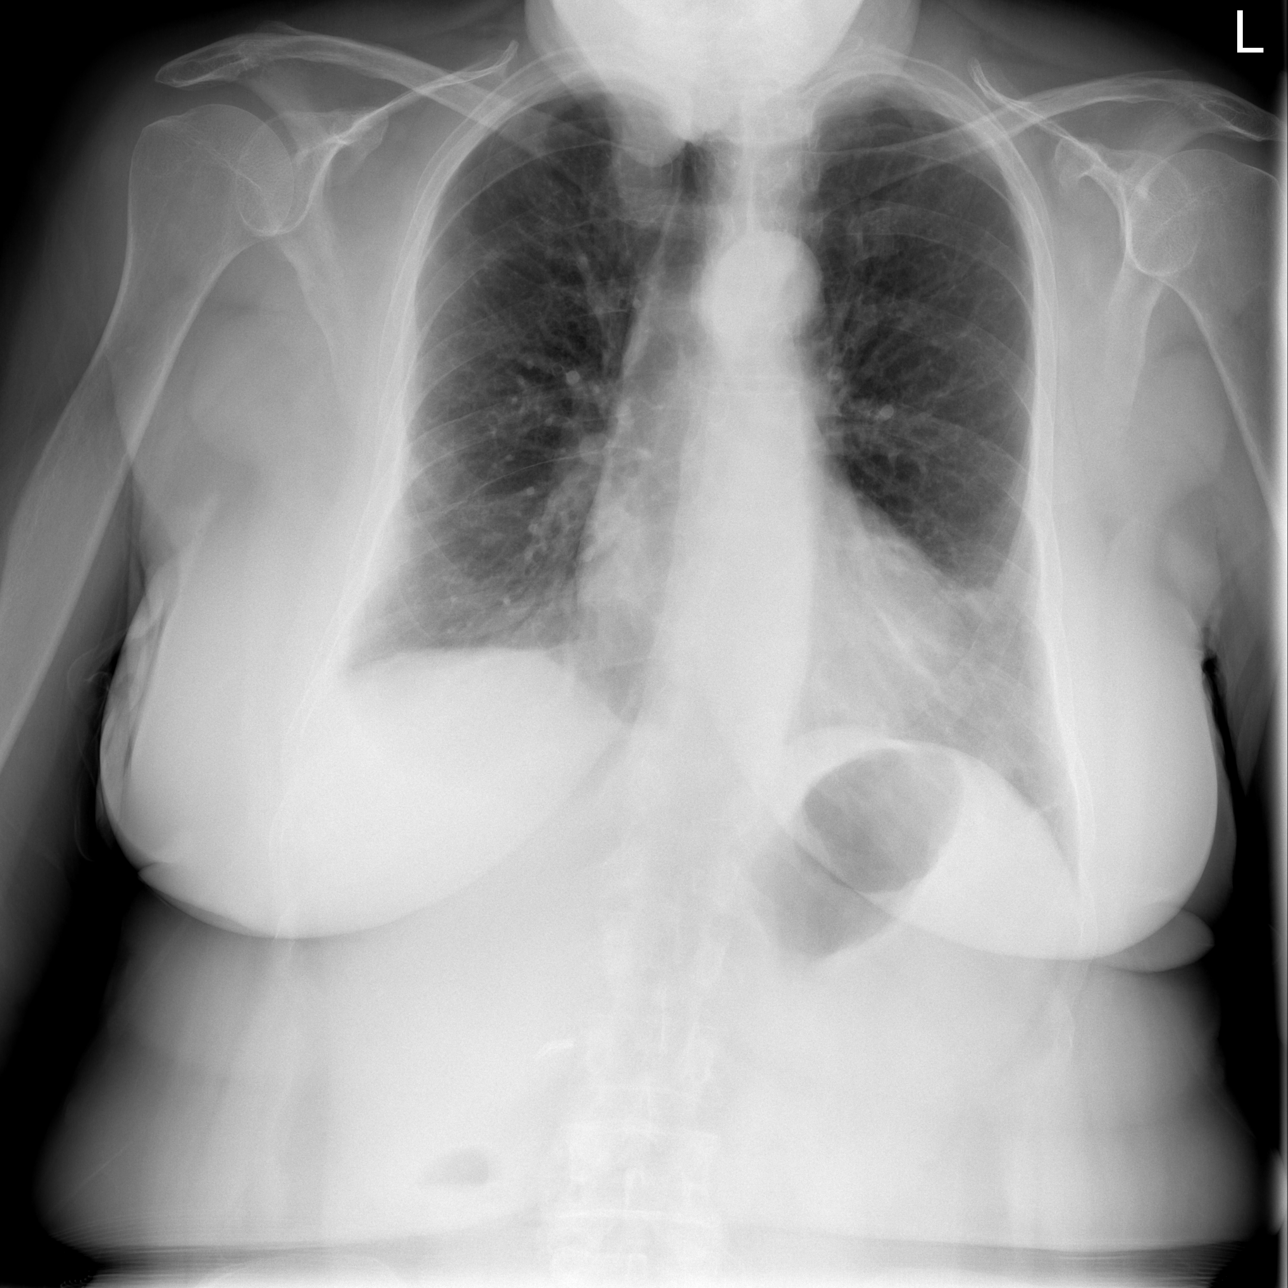

[w ribs ap/pa upper right]
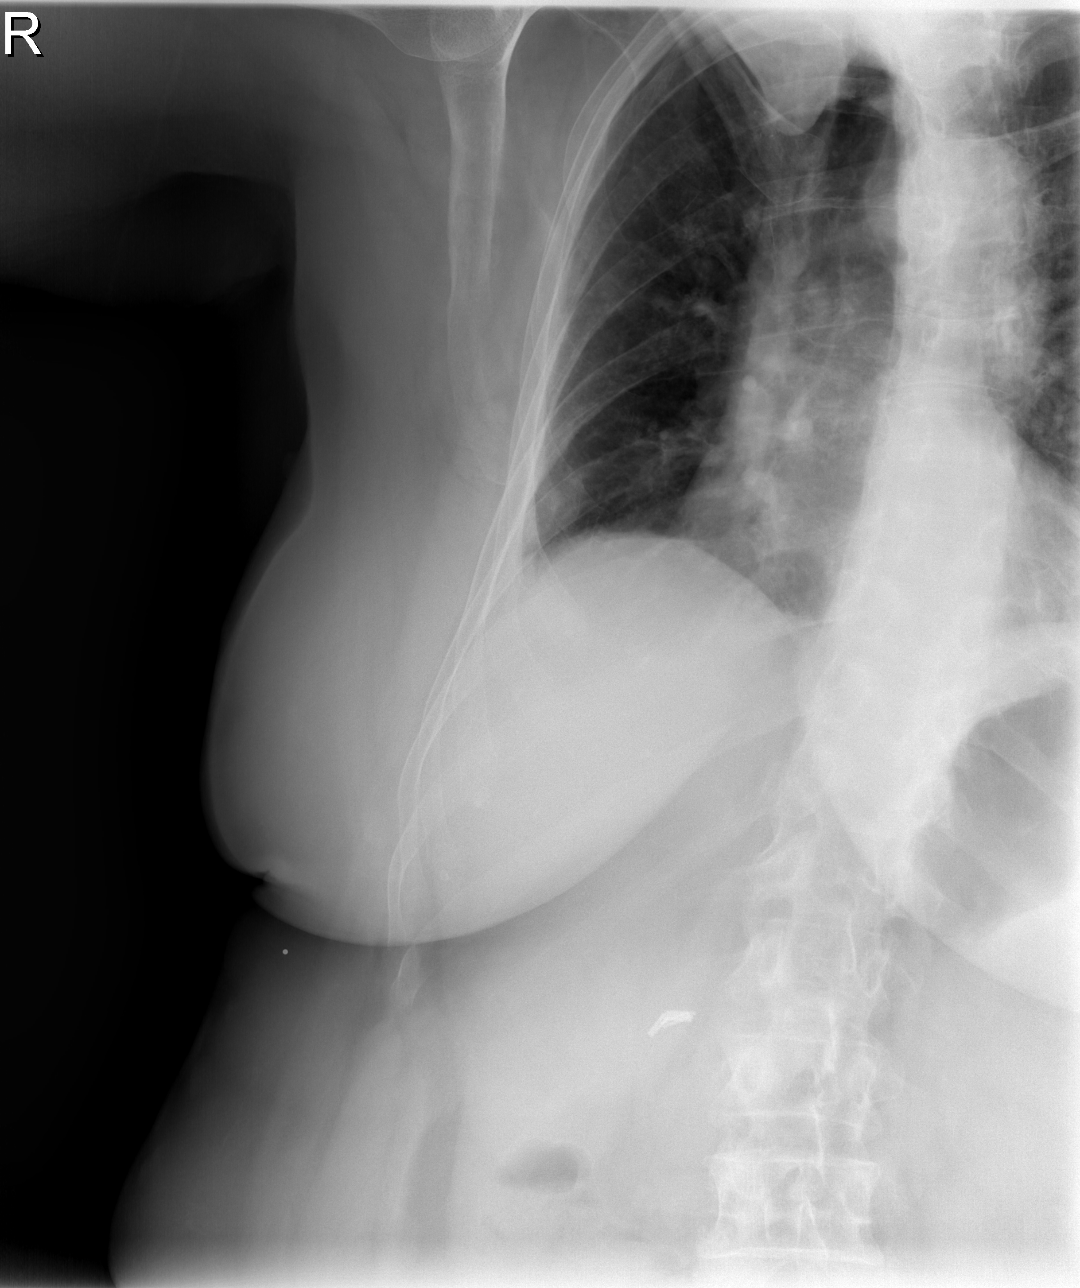

[w ribs ap/pa lower right]
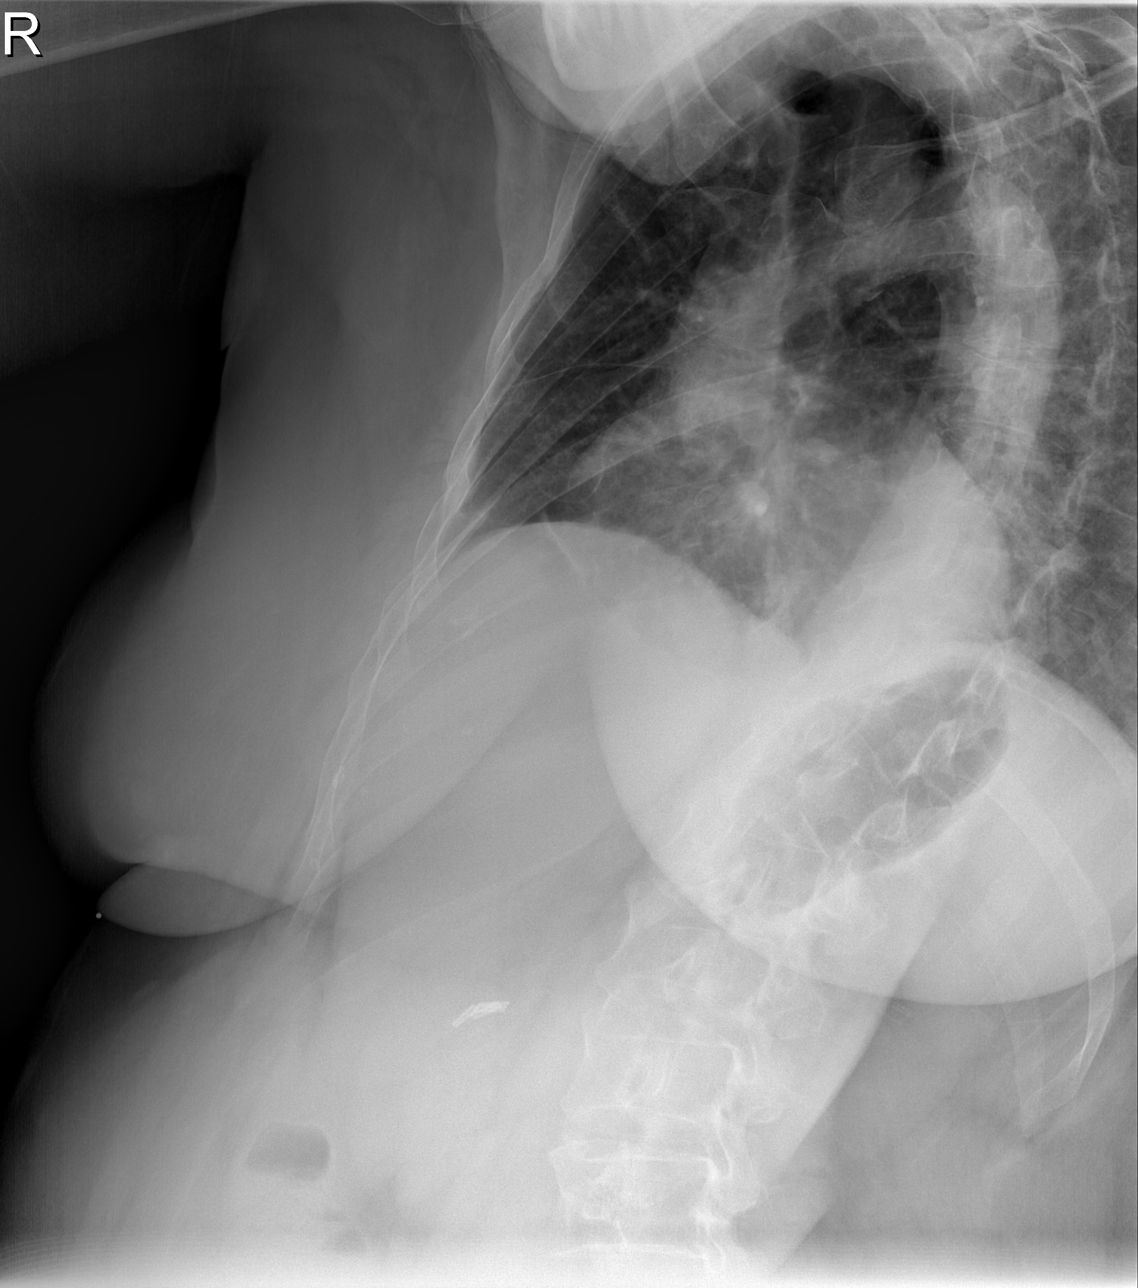

[3 of 3 positions shown; findings below may reference images not displayed]

FINDINGS: No visible displaced rib fractures or other acute osseous injury.
Chronic hyperinflation of the lungs with some coarsened interstitial
changes similar to prior. No consolidation, features of edema,
pneumothorax, or effusion. The cardiomediastinal contours are
unremarkable. Cholecystectomy clips in the right upper quadrant.
IMPRESSION: 1. No visible displaced rib fractures or other acute osseous injury.
2. Chronically hyperinflated lungs with coarsened interstitial
changes, similar to prior.

## 2020-10-23 ENCOUNTER — Telehealth: Payer: Self-pay | Admitting: Family Medicine

## 2020-10-23 NOTE — Telephone Encounter (Signed)
Called pt to scheduled appt for 2nd Evenity--no answer--message left--glh

## 2020-11-27 IMAGING — DX DG THORACIC SPINE 2V
3 series · 3 of 3 positions shown · non-contrast
Comparison: Chest radiograph 10/19/2013. No prior thoracic spine
imaging.

CLINICAL DATA: Mid back pain. Fall 6 weeks ago. Right rib fractures
at that time. Persistent lower thoracic back pain.

EXAM:
THORACIC SPINE 2 VIEWS

[t-spine ap]
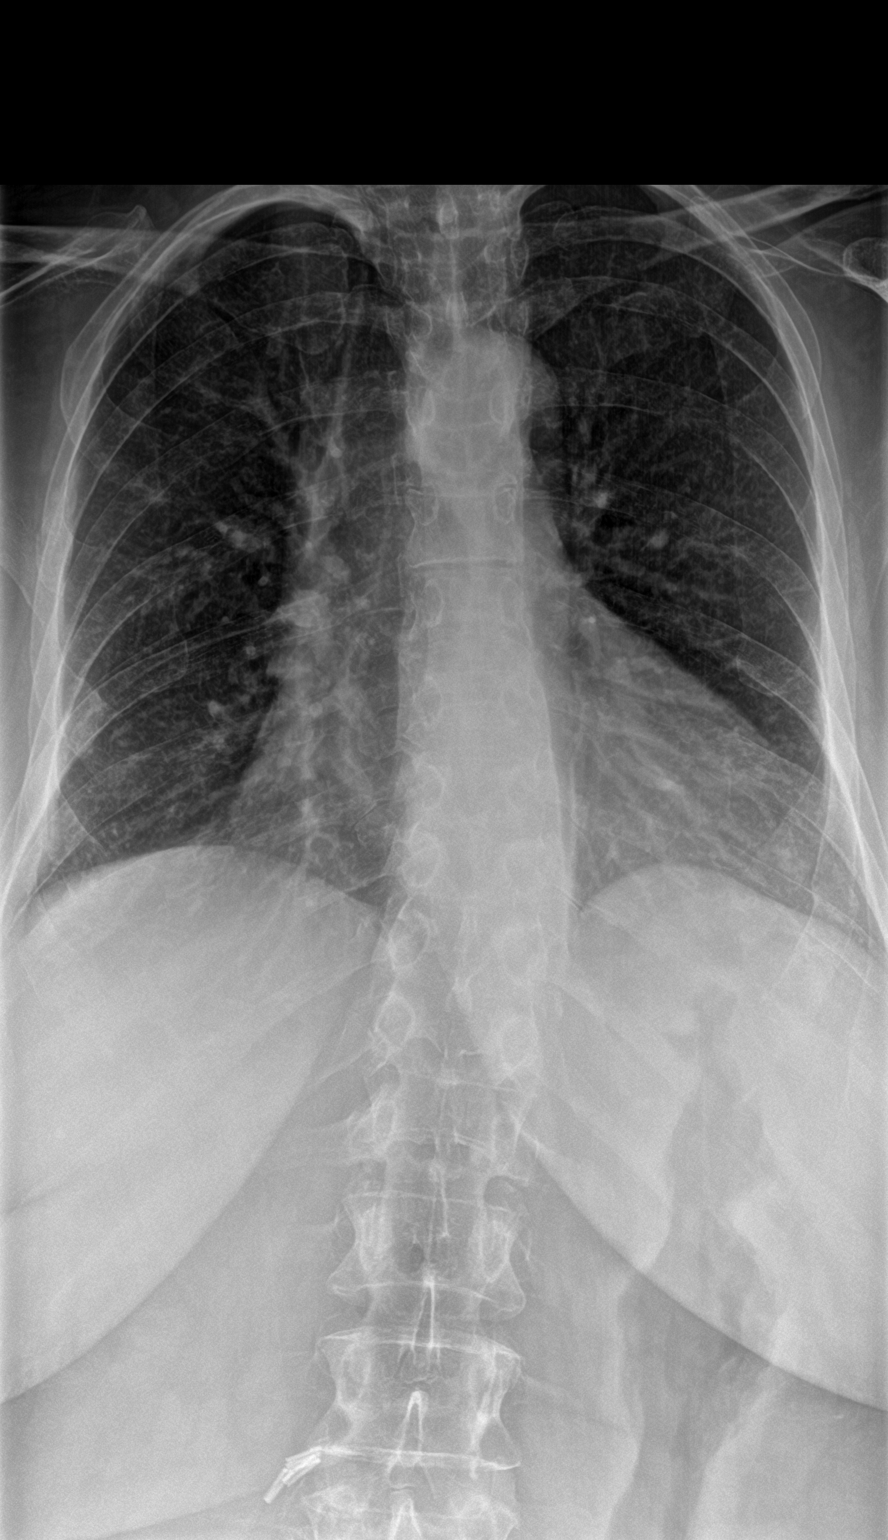

[t-spine lat]
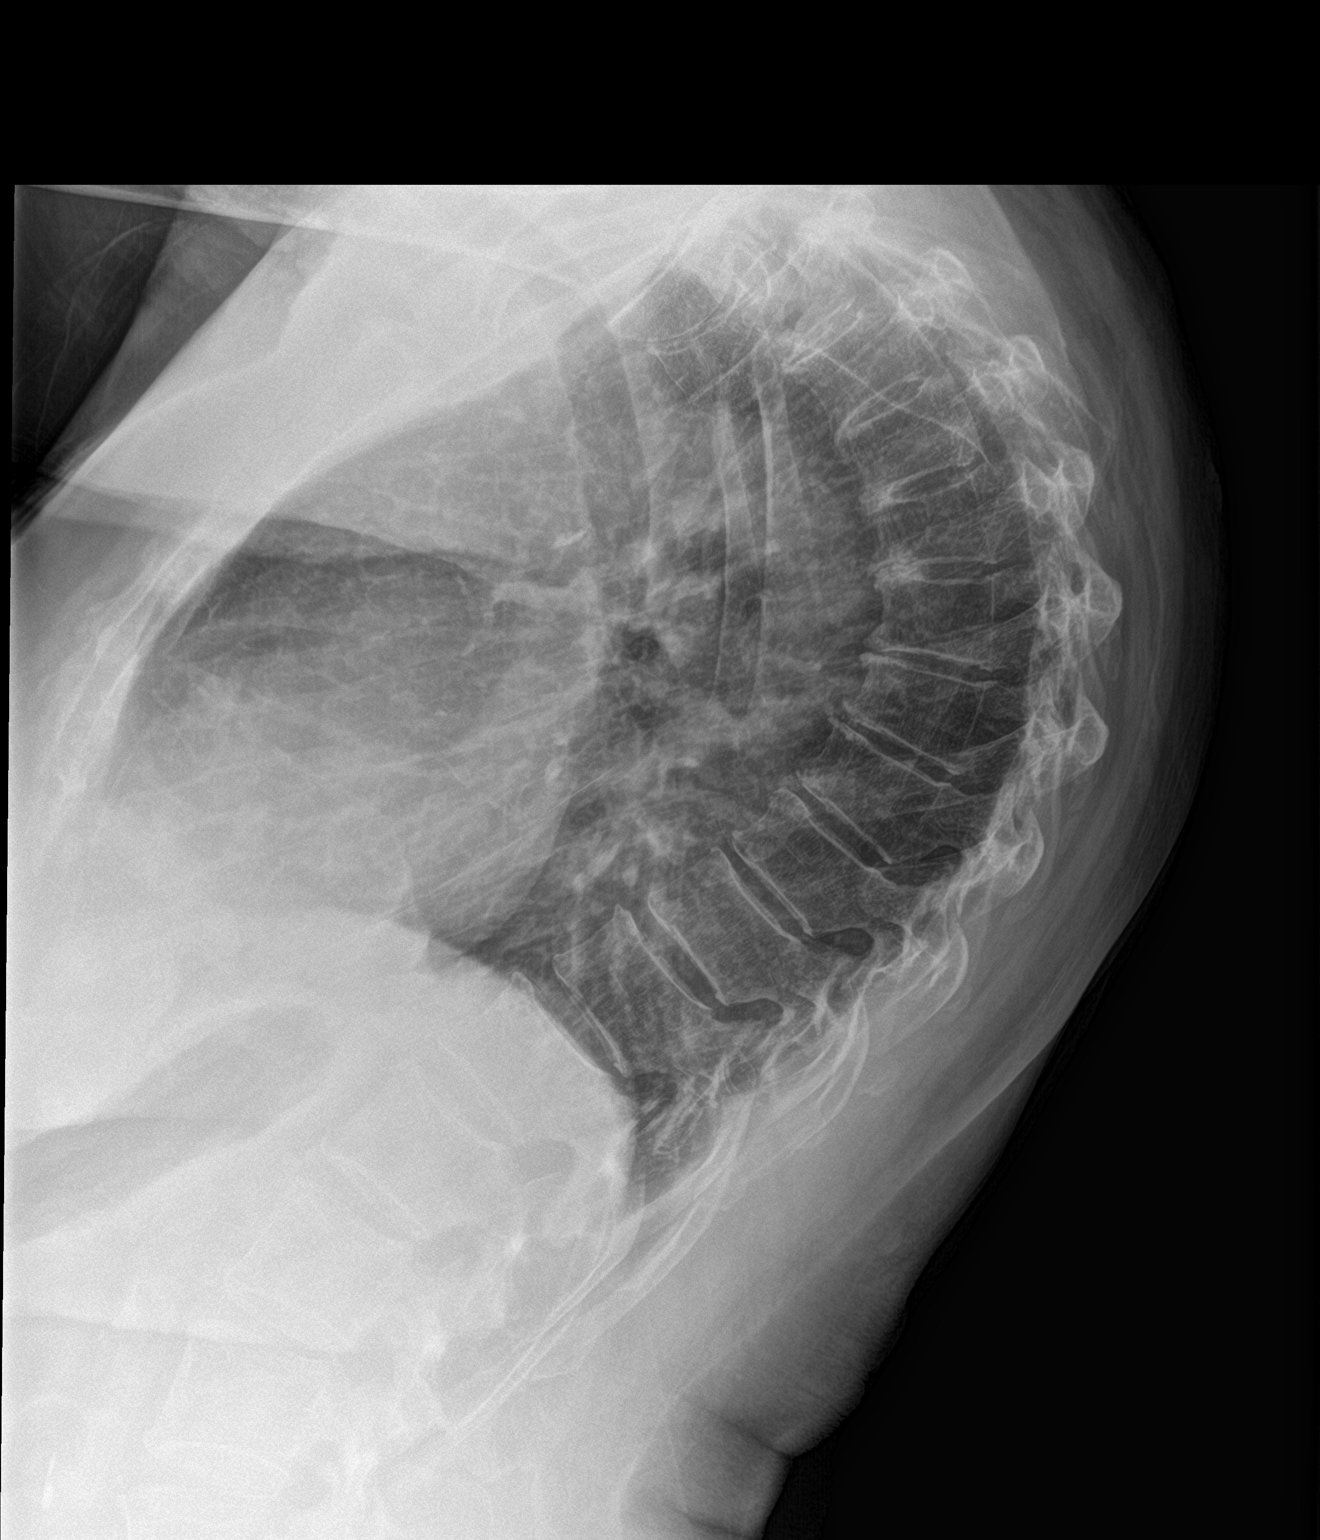

[t-spine swimmers]
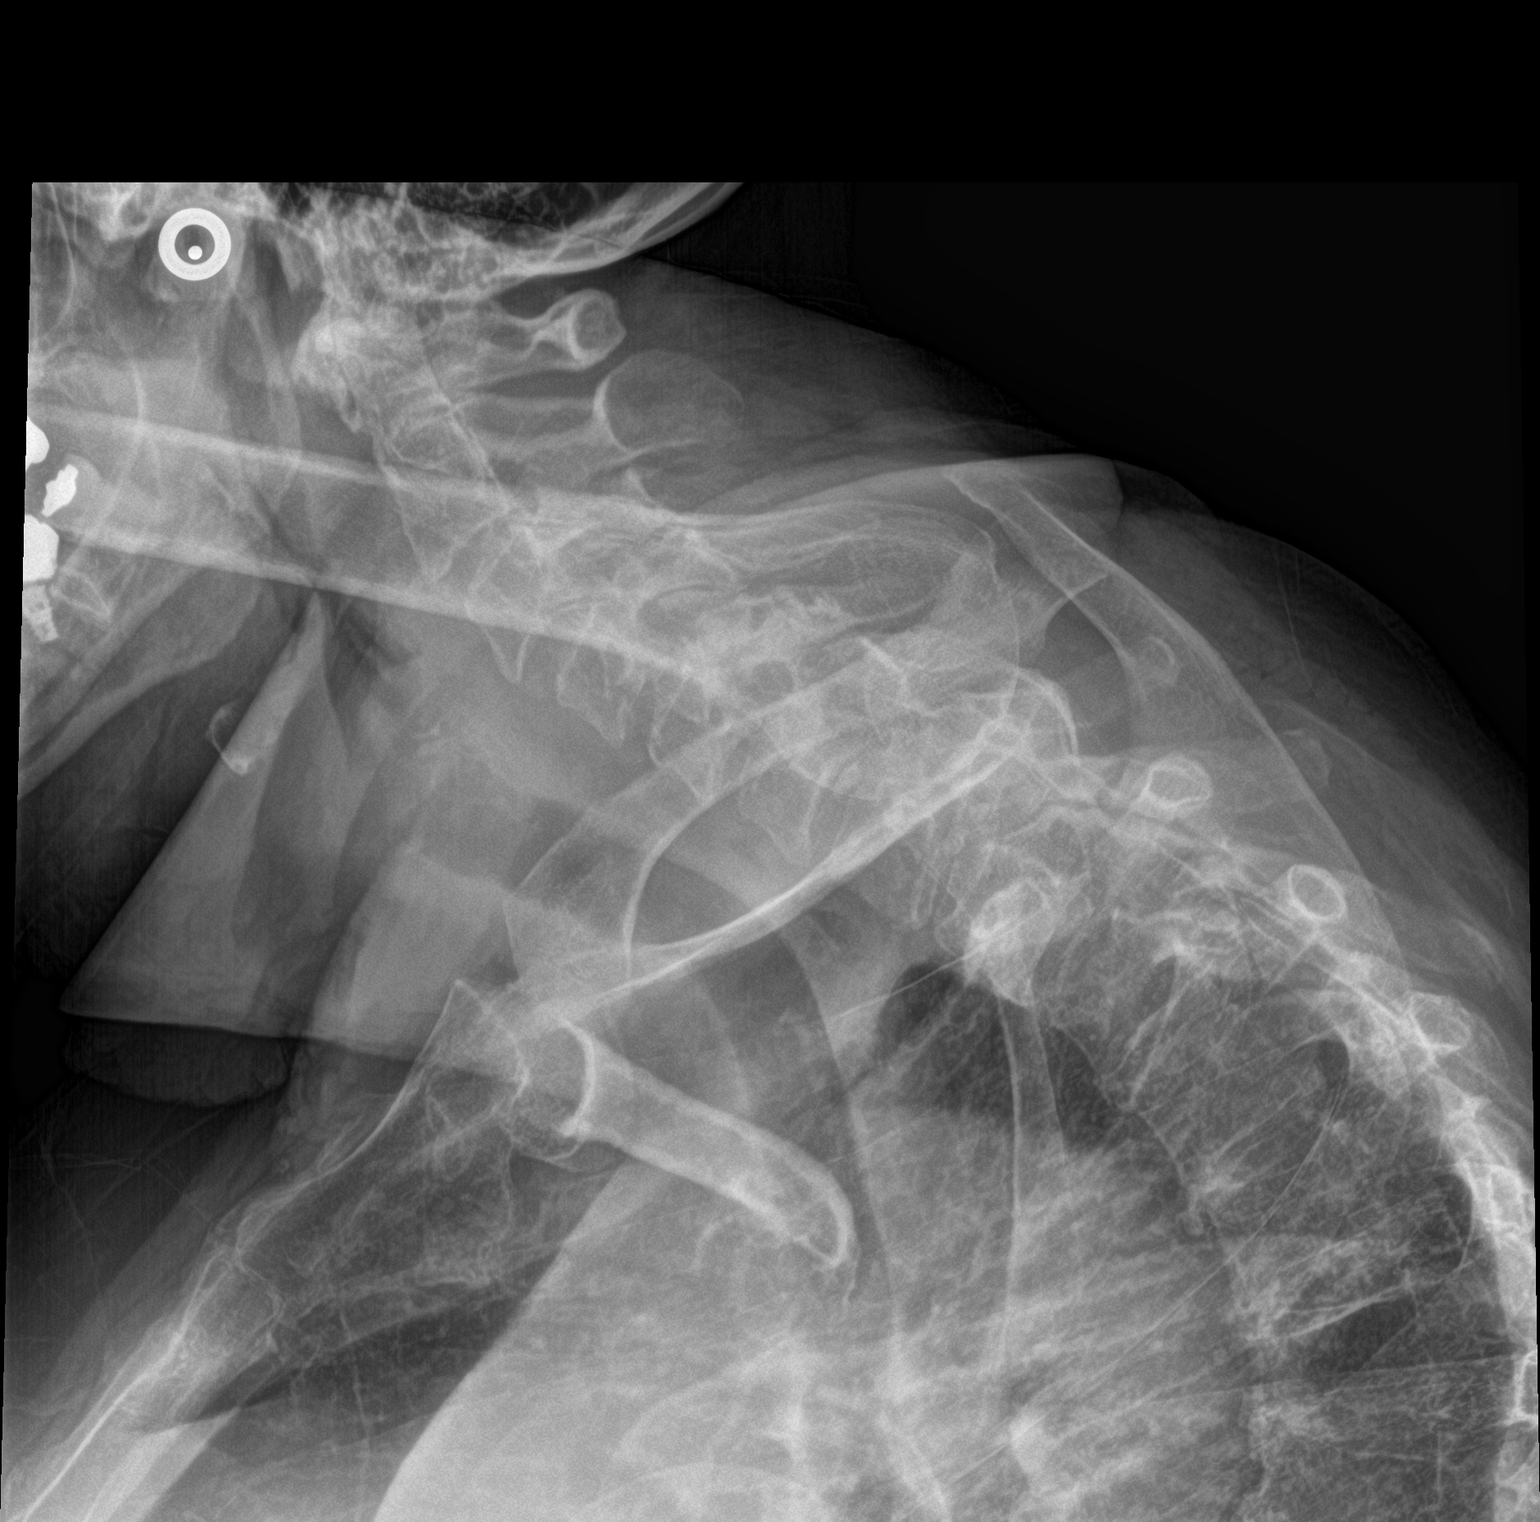

[3 of 3 positions shown; findings below may reference images not displayed]

FINDINGS: Exaggerated thoracic kyphosis. No evidence of fracture or
compression deformity. There is mild chronic flattening of
midthoracic vertebra that is stable from prior chest radiograph.
Mild disc space narrowing and endplate spurring most prominent in
the midthoracic spine. No paravertebral soft tissue abnormality to
suggest fracture. Patient's known right rib fractures are not well
seen.
IMPRESSION: Exaggerated thoracic kyphosis with mild degenerative change. No
evidence of acute or healing thoracic spine fracture.

## 2021-01-31 NOTE — Telephone Encounter (Signed)
Kristi Long, please follow up with pt to see if she plans to continue with Evenity injections.   Let me know so that I can re-check benefits or archive in [employer] portal.   Thanks!

## 2021-02-06 NOTE — Telephone Encounter (Signed)
I spoke to pt- she states she has been in the hospital twice recently and has fallen again just last week. I informed her we need to get her back on regular Evenity injections as soon as possible due to her high risk for falls. She states she would like to hold off a little longer. She will contact our office when she is ready to resume Evenity.

## 2021-04-01 NOTE — Telephone Encounter (Signed)
Pt archived in [email or web address].  Please advise if patient and/or provider wish to proceed with Evenity therapy.  

## 2022-11-17 ENCOUNTER — Encounter: Payer: Self-pay | Admitting: *Deleted
